# Patient Record
Sex: Female | Born: 1980 | Race: White | Hispanic: No | Marital: Married | Smoking: Current every day smoker
Health system: Southern US, Community
[De-identification: ages and names within clinical notes are randomized; demographics above are authoritative.]

## PROBLEM LIST (undated history)

## (undated) HISTORY — PX: TUBAL LIGATION: SHX77

---

## 2005-10-01 ENCOUNTER — Emergency Department (HOSPITAL_COMMUNITY): Admission: EM | Admit: 2005-10-01 | Discharge: 2005-10-01 | Payer: Self-pay | Admitting: Emergency Medicine

## 2009-05-05 ENCOUNTER — Emergency Department (HOSPITAL_COMMUNITY): Admission: EM | Admit: 2009-05-05 | Discharge: 2009-05-05 | Payer: Self-pay | Admitting: Emergency Medicine

## 2010-11-11 LAB — WET PREP, GENITAL: Trich, Wet Prep: NONE SEEN

## 2010-11-11 LAB — COMPREHENSIVE METABOLIC PANEL
ALT: 13 U/L (ref 0–35)
Albumin: 4.1 g/dL (ref 3.5–5.2)
Alkaline Phosphatase: 54 U/L (ref 39–117)
Calcium: 9.1 mg/dL (ref 8.4–10.5)
Chloride: 105 mEq/L (ref 96–112)
GFR calc Af Amer: >60 mL/min (ref >60)
Glucose, Bld: 103 mg/dL — ABNORMAL HIGH (ref 70–99)
Potassium: 3.8 mEq/L (ref 3.5–5.1)
Sodium: 137 mEq/L (ref 135–145)
Total Protein: 7.1 g/dL (ref 6.0–8.3)

## 2010-11-11 LAB — URINALYSIS, ROUTINE W REFLEX MICROSCOPIC
Bilirubin Urine: NEGATIVE
Glucose, UA: 100 mg/dL — AB
Hgb urine dipstick: NEGATIVE
Nitrite: NEGATIVE
Specific Gravity, Urine: 1.001 — ABNORMAL LOW (ref 1.005–1.030)
pH: 7 (ref 5.0–8.0)

## 2010-11-11 LAB — DIFFERENTIAL
Eosinophils Relative: 0 % (ref 0–5)
Lymphocytes Relative: 12 % (ref 12–46)
Lymphs Abs: 1 10*3/uL (ref 0.7–4.0)
Monocytes Absolute: 1.1 10*3/uL — ABNORMAL HIGH (ref 0.1–1.0)
Monocytes Relative: 13 % — ABNORMAL HIGH (ref 3–12)
Neutrophils Relative %: 75 % (ref 43–77)

## 2010-11-11 LAB — CBC
MCHC: 34.3 g/dL (ref 30.0–36.0)
RBC: 4.64 MIL/uL (ref 3.87–5.11)
WBC: 8.4 10*3/uL (ref 4.0–10.5)

## 2010-12-23 NOTE — Consult Note (Signed)
NAMESANJANA, Tammy Ramsey             ACCOUNT NO.:  [id number]   MEDICAL RECORD NO.:  [id number]          PATIENT TYPE:  EMS   LOCATION:  MAJO                         FACILITY:  [facility]   PHYSICIAN:  Gabrielle Dare. Janee Morn, M.D.DATE OF BIRTH:  1981-03-16   DATE OF CONSULTATION:  10/01/2005  DATE OF DISCHARGE:                                   CONSULTATION   REASON FOR CONSULTATION:  Constipation and abdominal pain.   HISTORY OF PRESENT ILLNESS:  I was asked to evaluate this 30 year old white  female who is five days postpartum in regards to constipation and abdominal  pain by Dr. Carleene Cooper.  The patient has had some workup in the emergency  department including laboratory studies and CT scan of the abdomen and  pelvis.  This demonstrated some mild ileus but no evidence of inflammation  in the right lower quadrant though the appendix was not clearly visualized.  She claims her abdominal pain is better at this time but her main complaint  remains constipation which extends even prior to her delivery.  She also  complains of some vaginal burning.  White blood cell count 17,000 and she is  afebrile.  On her pelvic examination, Dr. Ignacia Palma reported some external  hemorrhoids present.   PAST MEDICAL HISTORY:  Negative.   PAST SURGICAL HISTORY:  Tubal ligation postpartum.   SOCIAL HISTORY:  She smokes cigarettes.   PHYSICAL EXAMINATION:  VITAL SIGNS:  Temperature 97.4, blood pressure  142/64, pulse 96, respirations 18.  GENERAL:  She is awake and alert.  LUNGS:  Lung have mild wheeze, right greater than left.  HEART:  Heart rate is regular.  ABDOMEN:  Abdomen is mildly distended but soft.  She had some minimal  suprapubic tenderness, no masses, no guarding is present, no  hepatosplenomegaly is noted, bowel sounds are present.  EXTREMITIES:  Warm with no significant edema.   DATA REVIEWED:  CT scan findings and laboratory studies as above.   IMPRESSION:  1.  Significant constipation.  2.  External hemorrhoids.  3.  Mild ileus.  4.  No need at this time for emergent surgery.  I recommend MiraLax, Anusol      HC p.r.n. for her hemorrhoids, and I also feel that the patient's CT      oral contrast will help promote bowel movement in the very near future.      Plan for addressing her constipation and hemorrhoids was discussed in      detail with Dr. Ignacia Palma.      Gabrielle Dare Janee Morn, M.D.  Electronically Signed     BET/MEDQ  D:  10/01/2005  T:  10/02/2005  Job:  [id number]   cc:   Osvaldo Human, M.D.  Fax: [phone]

## 2012-07-07 ENCOUNTER — Encounter (HOSPITAL_COMMUNITY): Payer: Self-pay | Admitting: Emergency Medicine

## 2012-07-07 ENCOUNTER — Inpatient Hospital Stay (HOSPITAL_COMMUNITY)
Admission: EM | Admit: 2012-07-07 | Discharge: 2012-07-10 | DRG: 581 | Disposition: A | Discharge status: Home / Self Care | Payer: Medicaid Other | Attending: General Surgery | Admitting: General Surgery

## 2012-07-07 ENCOUNTER — Emergency Department (HOSPITAL_COMMUNITY): Payer: Medicaid Other

## 2012-07-07 DIAGNOSIS — IMO0002 Reserved for concepts with insufficient information to code with codable children: Principal | ICD-10-CM | POA: Diagnosis present

## 2012-07-07 DIAGNOSIS — F17200 Nicotine dependence, unspecified, uncomplicated: Secondary | ICD-10-CM | POA: Diagnosis present

## 2012-07-07 DIAGNOSIS — M60009 Infective myositis, unspecified site: Secondary | ICD-10-CM

## 2012-07-07 LAB — CBC WITH DIFFERENTIAL/PLATELET
Eosinophils Absolute: 0.1 10*3/uL (ref 0.0–0.7)
Eosinophils Relative: 0 % (ref 0–5)
Lymphocytes Relative: 10 % — ABNORMAL LOW (ref 12–46)
MCH: 29.1 pg (ref 26.0–34.0)
MCHC: 34.8 g/dL (ref 30.0–36.0)
MCV: 83.8 fL (ref 78.0–100.0)
Monocytes Absolute: 1.7 10*3/uL — ABNORMAL HIGH (ref 0.1–1.0)
Monocytes Relative: 10 % (ref 3–12)
Neutro Abs: 13.1 10*3/uL — ABNORMAL HIGH (ref 1.7–7.7)
Neutrophils Relative %: 79 % — ABNORMAL HIGH (ref 43–77)
RDW: 14.2 % (ref 11.5–15.5)

## 2012-07-07 LAB — BASIC METABOLIC PANEL
CO2: 27 mEq/L (ref 19–32)
Calcium: 9.9 mg/dL (ref 8.4–10.5)
GFR calc non Af Amer: >90 mL/min (ref >90)

## 2012-07-07 MED ORDER — IOHEXOL 300 MG/ML  SOLN
100.0000 mL | Freq: Once | INTRAMUSCULAR | Status: AC | PRN
  Administered 2012-07-07: 100 mL via INTRAVENOUS

## 2012-07-07 MED ORDER — SODIUM CHLORIDE 0.9 % IV BOLUS (SEPSIS)
1000.0000 mL | Freq: Once | INTRAVENOUS | Status: AC
  Administered 2012-07-07: 1000 mL via INTRAVENOUS

## 2012-07-07 MED ORDER — VANCOMYCIN HCL IN DEXTROSE 1-5 GM/200ML-% IV SOLN
1000.0000 mg | Freq: Once | INTRAVENOUS | Status: AC
  Administered 2012-07-07: 1000 mg via INTRAVENOUS
  Filled 2012-07-07: qty 200

## 2012-07-07 MED ORDER — HYDROMORPHONE HCL PF 1 MG/ML IJ SOLN
1.0000 mg | Freq: Once | INTRAMUSCULAR | Status: AC
  Administered 2012-07-07: 1 mg via INTRAVENOUS
  Filled 2012-07-07: qty 1

## 2012-07-07 MED ORDER — HYDROMORPHONE HCL PF 1 MG/ML IJ SOLN
1.0000 mg | Freq: Once | INTRAMUSCULAR | Status: AC
Start: 2012-07-07 — End: 2012-07-07
  Administered 2012-07-07: 1 mg via INTRAVENOUS
  Filled 2012-07-07: qty 1

## 2012-07-07 MED ORDER — ONDANSETRON HCL 4 MG/2ML IJ SOLN
4.0000 mg | Freq: Once | INTRAMUSCULAR | Status: AC
  Administered 2012-07-07: 4 mg via INTRAVENOUS
  Filled 2012-07-07: qty 2

## 2012-07-07 MED ORDER — ACETAMINOPHEN 500 MG PO TABS
1000.0000 mg | ORAL_TABLET | Freq: Once | ORAL | Status: AC
  Administered 2012-07-07: 1000 mg via ORAL
  Filled 2012-07-07: qty 2

## 2012-07-07 NOTE — ED Provider Notes (Signed)
History     CSN: [id number]  Arrival date & time 07/07/12  1949   First MD Initiated Contact with Patient 07/07/12 2034      Chief Complaint  Patient presents with  . Cellulitis    left arm    (Consider location/radiation/quality/duration/timing/severity/associated sxs/prior treatment) HPI The patient presents with concerns of increasing pain in her left forearm, as well as fever, chills, nausea, vomiting. Yesterday, while at work the patient had a puncture wound of her left forearm.  Her left forearm was struck with a piece of steel.  Daily following that she was treated at another facility, with x-rays, fluids, tetanus, antibiotics.  She states that since discharge within 24 hours ago she has not been able to afford her antibiotics.  Over this time the pain, erythema, induration has increased with persistent nausea, vomiting, generalized discomfort.  Pain is worse with motion, palpation.  No relief with anything. No past medical history on file.  Past Surgical History  Procedure Date  . Tubal ligation     No family history on file.  History  Substance Use Topics  . Smoking status: Current Every Day Smoker -- 1.5 packs/day    Types: Cigarettes  . Smokeless tobacco: Not on file  . Alcohol Use: No    OB History    Grav Para Term Preterm Abortions TAB SAB Ect Mult Living                  Review of Systems  Constitutional:       Per HPI, otherwise negative  HENT:       Per HPI, otherwise negative  Eyes: Negative.   Respiratory:       Per HPI, otherwise negative  Cardiovascular:       Per HPI, otherwise negative  Gastrointestinal: Negative for vomiting.  Genitourinary: Negative.   Musculoskeletal:       Per HPI, otherwise negative  Skin: Negative.   Neurological: Negative for syncope.    Allergies  Review of patient's allergies indicates no known allergies.  Home Medications   Current Outpatient Rx  Name  Route  Sig  Dispense  Refill  . IBUPROFEN 200 MG  PO TABS   Oral   Take 400 mg by mouth every 6 (six) hours as needed. For pain           BP 132/80  Pulse 132  Temp 102.7 F (39.3 C) (Oral)  Resp 25  Ht 5\' 5"  (1.651 m)  Wt 130 lb (58.968 kg)  BMI 21.63 kg/m2  SpO2 97%  LMP 06/10/2012  Physical Exam  Nursing note and vitals reviewed. Constitutional: She is oriented to person, place, and time. She appears well-developed and well-nourished. She appears distressed.       Uncomfortable appearing F- slight diaphoresis  HENT:  Head: Normocephalic and atraumatic.  Eyes: Conjunctivae normal and EOM are normal.  Cardiovascular: Regular rhythm.  Tachycardia present.   Pulmonary/Chest: Breath sounds normal. No stridor. Tachypnea noted. No respiratory distress.  Abdominal: She exhibits no distension.  Musculoskeletal: She exhibits no edema.       Left shoulder: Normal.       Arms: Neurological: She is alert and oriented to person, place, and time. She displays no atrophy. No cranial nerve deficit or sensory deficit. She exhibits normal muscle tone.  Skin: Skin is warm. She is diaphoretic.  Psychiatric: She has a normal mood and affect.    ED Course  Procedures (including critical care time)  Immediately after  arrival, with concerns of sepsis, and progression of her wound, given the tachycardia, tachypnea, fever, she received IV fluids, empiric vancomycin.   Labs Reviewed  CBC WITH DIFFERENTIAL  BASIC METABOLIC PANEL   No results found.   No diagnosis found.  O2- 99%ra, normal  Update: Patient is minimally improved - still tachycardic.    I interpreted the CT, and spoke with our radiologist about the findings.  2315: I d/w Dr. Victorino Dike (ortho).  He was quite helpful, and recommends that I consult hand surgery  2400: I discussed the case with Dr. Izora Ribas who will evaluate and manage the patient.   12:09 AM The patient remains in similar condition, requiring additional meds frequently.  She is less tachycardic.  MDM   This young female presents with increasing pain on a left forearm lesion.  On exam the patient has an indurated lesion that is exquisitely tender to palpation.  She is afebrile, tachycardic.  Given these findings are suspicious for abscess formation.  A CT of the forearm demonstrates intramuscular abscess formation, approximately 10 x 2 x 1.  The patient was empirically started on vancomycin, fluids soon after my initial evaluation.  She requires additional evaluation and management by our orthopedic colleagues.  Dr. Izora Ribas will see the patient.  CRITICAL CARE Performed by: Gerhard Munch   Total critical care time: 35  Critical care time was exclusive of separately billable procedures and treating other patients.  Critical care was necessary to treat or prevent imminent or life-threatening deterioration.  Critical care was time spent personally by me on the following activities: development of treatment plan with patient and/or surrogate as well as nursing, discussions with consultants, evaluation of patient's response to treatment, examination of patient, obtaining history from patient or surrogate, ordering and performing treatments and interventions, ordering and review of laboratory studies, ordering and review of radiographic studies, pulse oximetry and re-evaluation of patient's condition.      Gerhard Munch, MD 07/08/12 [phone]

## 2012-07-07 NOTE — ED Notes (Signed)
[id number]<AV> Expected date:<BR> Expected time:<BR> Means of arrival:<BR> Comments:<BR> Triage 3

## 2012-07-07 NOTE — ED Notes (Signed)
Patient states she cut her arm 2 days ago and was seen for the same.  Patient states she received a shot and prescriptions for ABX last night, but is unable to pay for them.  Laceration, redness, swelling, and pain noted in left arm.  Patient reports fevers, and nausea/vomiting.

## 2012-07-08 ENCOUNTER — Encounter (HOSPITAL_COMMUNITY)
Admission: EM | Disposition: A | Discharge status: Home / Self Care | Payer: Self-pay | Source: Home / Self Care | Attending: General Surgery

## 2012-07-08 ENCOUNTER — Encounter (HOSPITAL_COMMUNITY): Payer: Self-pay | Admitting: Anesthesiology

## 2012-07-08 ENCOUNTER — Emergency Department (HOSPITAL_COMMUNITY): Payer: Medicaid Other | Admitting: Anesthesiology

## 2012-07-08 HISTORY — PX: I & D EXTREMITY: SHX5045

## 2012-07-08 SURGERY — IRRIGATION AND DEBRIDEMENT EXTREMITY
Anesthesia: General | Site: Arm Lower | Laterality: Left | Wound class: Dirty or Infected

## 2012-07-08 MED ORDER — SUCCINYLCHOLINE CHLORIDE 20 MG/ML IJ SOLN
INTRAMUSCULAR | Status: DC | PRN
  Administered 2012-07-08: 100 mg via INTRAVENOUS

## 2012-07-08 MED ORDER — LIDOCAINE HCL (CARDIAC) 20 MG/ML IV SOLN
INTRAVENOUS | Status: DC | PRN
  Administered 2012-07-08: 80 mg via INTRAVENOUS

## 2012-07-08 MED ORDER — SODIUM CHLORIDE 0.9 % IV SOLN
Freq: Once | INTRAVENOUS | Status: AC
  Administered 2012-07-08: 01:00:00 via INTRAVENOUS

## 2012-07-08 MED ORDER — SODIUM CHLORIDE 0.9 % IV SOLN
3.0000 g | Freq: Four times a day (QID) | INTRAVENOUS | Status: DC
  Administered 2012-07-08: 3 g via INTRAVENOUS
  Filled 2012-07-08 (×5): qty 3

## 2012-07-08 MED ORDER — SODIUM CHLORIDE 0.9 % IV BOLUS (SEPSIS): 200.0000 mL | Freq: Once | INTRAVENOUS | Status: DC

## 2012-07-08 MED ORDER — OXYCODONE-ACETAMINOPHEN 5-325 MG PO TABS
1.0000 | ORAL_TABLET | ORAL | Status: DC | PRN
  Administered 2012-07-08 – 2012-07-10 (×11): 2 via ORAL
  Filled 2012-07-08 (×12): qty 2

## 2012-07-08 MED ORDER — HYDROMORPHONE HCL PF 1 MG/ML IJ SOLN
INTRAMUSCULAR | Status: AC
  Administered 2012-07-08: 03:00:00
  Filled 2012-07-08: qty 1

## 2012-07-08 MED ORDER — VANCOMYCIN HCL IN DEXTROSE 1-5 GM/200ML-% IV SOLN
1000.0000 mg | Freq: Two times a day (BID) | INTRAVENOUS | Status: DC
  Administered 2012-07-08 – 2012-07-10 (×5): 1000 mg via INTRAVENOUS
  Filled 2012-07-08 (×7): qty 200

## 2012-07-08 MED ORDER — ZOLPIDEM TARTRATE 5 MG PO TABS: 5.0000 mg | ORAL_TABLET | Freq: Every evening | ORAL | Status: DC | PRN

## 2012-07-08 MED ORDER — BUPIVACAINE HCL (PF) 0.5 % IJ SOLN
INTRAMUSCULAR | Status: DC | PRN
  Administered 2012-07-08: 25 mL

## 2012-07-08 MED ORDER — SODIUM CHLORIDE 0.9 % IV SOLN
INTRAVENOUS | Status: DC | PRN
  Administered 2012-07-08: 02:00:00 via INTRAVENOUS

## 2012-07-08 MED ORDER — FENTANYL CITRATE 0.05 MG/ML IJ SOLN
INTRAMUSCULAR | Status: DC | PRN
  Administered 2012-07-08: 100 ug via INTRAVENOUS

## 2012-07-08 MED ORDER — ONDANSETRON HCL 4 MG/2ML IJ SOLN: 4.0000 mg | Freq: Four times a day (QID) | INTRAMUSCULAR | Status: DC | PRN

## 2012-07-08 MED ORDER — PROPOFOL 10 MG/ML IV BOLUS
INTRAVENOUS | Status: DC | PRN
  Administered 2012-07-08: 150 mg via INTRAVENOUS

## 2012-07-08 MED ORDER — LACTATED RINGERS IV SOLN: INTRAVENOUS | Status: DC

## 2012-07-08 MED ORDER — HYDROMORPHONE HCL PF 1 MG/ML IJ SOLN
0.2500 mg | INTRAMUSCULAR | Status: DC | PRN
  Administered 2012-07-08 (×4): 0.5 mg via INTRAVENOUS

## 2012-07-08 MED ORDER — SODIUM CHLORIDE 0.9 % IV SOLN
3.0000 g | Freq: Four times a day (QID) | INTRAVENOUS | Status: DC
  Administered 2012-07-08 – 2012-07-10 (×10): 3 g via INTRAVENOUS
  Filled 2012-07-08 (×12): qty 3

## 2012-07-08 MED ORDER — ONDANSETRON HCL 4 MG/2ML IJ SOLN
INTRAMUSCULAR | Status: DC | PRN
  Administered 2012-07-08: 4 mg via INTRAVENOUS

## 2012-07-08 MED ORDER — BUPIVACAINE HCL (PF) 0.25 % IJ SOLN
INTRAMUSCULAR | Status: AC
  Filled 2012-07-08: qty 30

## 2012-07-08 MED ORDER — VANCOMYCIN HCL IN DEXTROSE 1-5 GM/200ML-% IV SOLN: 1000.0000 mg | Freq: Two times a day (BID) | INTRAVENOUS | Status: DC

## 2012-07-08 MED ORDER — ONDANSETRON HCL 4 MG PO TABS
4.0000 mg | ORAL_TABLET | Freq: Four times a day (QID) | ORAL | Status: DC | PRN
  Filled 2012-07-08: qty 1

## 2012-07-08 MED ORDER — MORPHINE SULFATE 10 MG/ML IJ SOLN
4.0000 mg | INTRAMUSCULAR | Status: DC | PRN
  Administered 2012-07-08 – 2012-07-10 (×14): 4 mg via INTRAVENOUS
  Filled 2012-07-08 (×14): qty 1

## 2012-07-08 MED ORDER — PROMETHAZINE HCL 25 MG/ML IJ SOLN: 6.2500 mg | INTRAMUSCULAR | Status: DC | PRN

## 2012-07-08 MED ORDER — 0.9 % SODIUM CHLORIDE (POUR BTL) OPTIME
TOPICAL | Status: DC | PRN
  Administered 2012-07-08: 1000 mL

## 2012-07-08 MED ORDER — VITAMINS A & D EX OINT
TOPICAL_OINTMENT | CUTANEOUS | Status: AC
  Administered 2012-07-08: 5
  Filled 2012-07-08: qty 5

## 2012-07-08 SURGICAL SUPPLY — 37 items
BAG SPEC THK2 15X12 ZIP CLS (MISCELLANEOUS) ×1
BAG ZIPLOCK 12X15 (MISCELLANEOUS) ×2 IMPLANT
BANDAGE ELASTIC 4 VELCRO ST LF (GAUZE/BANDAGES/DRESSINGS) ×1 IMPLANT
BANDAGE GAUZE ELAST BULKY 4 IN (GAUZE/BANDAGES/DRESSINGS) ×2 IMPLANT
CANISTER SUCTION 2500CC (MISCELLANEOUS) ×2 IMPLANT
CLOTH BEACON ORANGE TIMEOUT ST (SAFETY) ×2 IMPLANT
CORDS BIPOLAR (ELECTRODE) ×2 IMPLANT
CUFF TOURN SGL QUICK 18 (TOURNIQUET CUFF) ×2 IMPLANT
CUFF TOURN SGL QUICK 24 (TOURNIQUET CUFF)
CUFF TRNQT CYL 24X4X40X1 (TOURNIQUET CUFF) IMPLANT
DRAIN CHANNEL RND F F (WOUND CARE) ×1 IMPLANT
DRAIN PENROSE 18X1/2 LTX STRL (DRAIN) IMPLANT
DRAPE SURG 17X11 SM STRL (DRAPES) ×4 IMPLANT
ELECT REM PT RETURN 9FT ADLT (ELECTROSURGICAL) ×2
ELECTRODE REM PT RTRN 9FT ADLT (ELECTROSURGICAL) ×1 IMPLANT
EVACUATOR SILICONE 100CC (DRAIN) ×1 IMPLANT
GAUZE PACKING IODOFORM 1/4X5 (PACKING) ×1 IMPLANT
GAUZE XEROFORM 1X8 LF (GAUZE/BANDAGES/DRESSINGS) ×1 IMPLANT
GLOVE BIOGEL M STRL SZ7.5 (GLOVE) ×2 IMPLANT
GLOVE BIOGEL PI IND STRL 6.5 (GLOVE) ×1 IMPLANT
GLOVE BIOGEL PI IND STRL 7.0 (GLOVE) ×1 IMPLANT
GLOVE BIOGEL PI IND STRL 7.5 (GLOVE) ×1 IMPLANT
GLOVE BIOGEL PI INDICATOR 6.5 (GLOVE) ×1
GLOVE BIOGEL PI INDICATOR 7.0 (GLOVE) ×1
GLOVE BIOGEL PI INDICATOR 7.5 (GLOVE) ×1
HANDPIECE INTERPULSE COAX TIP (DISPOSABLE)
KIT BASIN OR (CUSTOM PROCEDURE TRAY) ×2 IMPLANT
NEEDLE HYPO 22GX1.5 SAFETY (NEEDLE) ×2 IMPLANT
NS IRRIG 1000ML POUR BTL (IV SOLUTION) ×2 IMPLANT
PACK LOWER EXTREMITY WL (CUSTOM PROCEDURE TRAY) ×2 IMPLANT
POSITIONER SURGICAL ARM (MISCELLANEOUS) ×2 IMPLANT
SET HNDPC FAN SPRY TIP SCT (DISPOSABLE) ×1 IMPLANT
SPONGE GAUZE 4X4 12PLY (GAUZE/BANDAGES/DRESSINGS) ×1 IMPLANT
SUT PROLENE 4 0 PS 2 18 (SUTURE) ×1 IMPLANT
SWAB COLLECTION DEVICE MRSA (MISCELLANEOUS) ×1 IMPLANT
SYR CONTROL 10ML LL (SYRINGE) ×3 IMPLANT
TUBE ANAEROBIC SPECIMEN COL (MISCELLANEOUS) ×2 IMPLANT

## 2012-07-08 NOTE — Transfer of Care (Signed)
Immediate Anesthesia Transfer of Care Note  Patient: Tammy Ramsey  Procedure(s) Performed: Procedure(s) (LRB) with comments: IRRIGATION AND DEBRIDEMENT EXTREMITY (Left) - arm   Patient Location: PACU  Anesthesia Type:General  Level of Consciousness: awake, alert , oriented and patient cooperative  Airway & Oxygen Therapy: Patient Spontanous Breathing and Patient connected to face mask oxygen  Post-op Assessment: Report given to PACU RN, Post -op Vital signs reviewed and stable and Patient moving all extremities X 4  Post vital signs: Reviewed and stable  Complications: No apparent anesthesia complications

## 2012-07-08 NOTE — Plan of Care (Signed)
Problem: Phase I Progression Outcomes Goal: Incision/dressings dry and intact Outcome: Progressing Ace wrap feels appropriate fit, fingers cool, blanch slowly. Barnett Hatter P

## 2012-07-08 NOTE — Op Note (Signed)
NAMEKENSLIE, ABBRUZZESE             ACCOUNT NO.:  [id number]  MEDICAL RECORD NO.:  [id number]  LOCATION:  1311                         FACILITY:  [facility]  PHYSICIAN:  Johnette Abraham, MD    DATE OF BIRTH:  09-22-1980  DATE OF PROCEDURE:  07/08/2012 DATE OF DISCHARGE:                              OPERATIVE REPORT   PREOPERATIVE DIAGNOSIS:  Cellulitis and abscess of the left forearm.  POSTOPERATIVE DIAGNOSIS:  Cellulitis and abscess of the left forearm.  PROCEDURE:  Incision and drainage of abscess of left forearm, limited fasciotomy of left forearm, and insertion of a Blake drain and packing with Iodoform gauze.  ANESTHESIA:  General.  INDICATIONS:  Ms. Deutschman is a 31 year old female who states she cut her arm a couple of days ago.  She presented to the hospital, was given antibiotics. Did not take her oral antibiotics.  Her arm became progressively more swollen and painful.  She re-presented and was worked up by the emergency room here at [facility] with a CAT scan of the arm showing an abscess.  She had a white count of 16,000.  I was consulted for definitive repair.  On evaluation, she had a tensed arm with spreading cellulitis and warmth suggestive of infection.  Risk, benefits, and alternatives of surgery were thoroughly discussed with her including I and D and hospital admission.  She agreed with this plan and consent.  PROCEDURE:  The patient was taken to the operating room, placed supine on the operating table.  Time-out was performed.  General anesthesia was administered without difficulty.  The arm was exsanguinated.  The tourniquet was then inflated to 250 mmHg overlying the previous laceration.  This laceration was opened and lengthened for a few cm. Dissection was carried down to the fascia. The fascia was opened and purulent material was encountered.  This was sent for aerobic and anaerobic cultures.  Probing of the cavity revealed that the CT scan suggested with  abscess formation both proximally and distally.  The fascia was opened in these locations and thoroughly irrigated.  There was quite a bit of purulence here.  Following there was an additional incision was made, both proximally and distally and the abscess cavity was entered for more thorough irrigation of the cavity.  Through these puncture wounds, a partial fasciotomy was performed.  After thorough irrigation, a 16-French Blake drain was inserted from the proximal portion distally, furthermore quarter-inch Iodoform gauze was placed in each 1 of the 3 incisions.  A sterile dressing and Ace wrap were placed. Patient tolerated procedure well and awakened from anesthesia.  She will be admitted to the hospital, placed on IV antibiotics.  Cultures followed.  She will probably need to have a repeat __________ to get the drain out as this may be uncomfortable. After the tourniquet was released, hemostasis was controlled with direct pressure.  There was no significant active bleeding.  We sent the operative note for Ms. Pais.     Johnette Abraham, MD     HCC/MEDQ  D:  07/08/2012  T:  07/08/2012  Job:  [id number]

## 2012-07-08 NOTE — H&P (Signed)
Reason for Consult:infection Referring Physician: ER  Tammy Ramsey is an 31 y.o. right handed female.  HPI: pt cut, ?fb in L forearm on Friday, c/o pain and swelling, worsened, presented to [facility] ER, give abx IV in ER, give rx for antibiotics, pt did not fill, arm became worse today with increased swelling, redness, presented to [facility] ER.  History reviewed. No pertinent past medical history.  Past Surgical History  Procedure Date  . Tubal ligation   Hand surgery - L  History reviewed. No pertinent family history.  Social History:  reports that she has been smoking Cigarettes.  She has been smoking about 1.5 packs per day. She does not have any smokeless tobacco history on file. She reports that she does not drink alcohol or use illicit drugs.  Allergies: No Known Allergies  Medications: I have reviewed the patient's current medications.  Results for orders placed during the hospital encounter of 07/07/12 (from the past 48 hour(s))  CBC WITH DIFFERENTIAL     Status: Abnormal   Collection Time   07/07/12  9:40 PM      Component Value Range Comment   WBC 16.5 (*) 4.0 - 10.5 K/uL    RBC 5.25 (*) 3.87 - 5.11 MIL/uL    Hemoglobin 15.3 (*) 12.0 - 15.0 g/dL    HCT 19.1  47.8 - 29.5 %    MCV 83.8  78.0 - 100.0 fL    MCH 29.1  26.0 - 34.0 pg    MCHC 34.8  30.0 - 36.0 g/dL    RDW 62.1  30.8 - 65.7 %    Platelets 241  150 - 400 K/uL    Neutrophils Relative 79 (*) 43 - 77 %    Neutro Abs 13.1 (*) 1.7 - 7.7 K/uL    Lymphocytes Relative 10 (*) 12 - 46 %    Lymphs Abs 1.7  0.7 - 4.0 K/uL    Monocytes Relative 10  3 - 12 %    Monocytes Absolute 1.7 (*) 0.1 - 1.0 K/uL    Eosinophils Relative 0  0 - 5 %    Eosinophils Absolute 0.1  0.0 - 0.7 K/uL    Basophils Relative 0  0 - 1 %    Basophils Absolute 0.0  0.0 - 0.1 K/uL   BASIC METABOLIC PANEL     Status: Abnormal   Collection Time   07/07/12  9:40 PM      Component Value Range Comment   Sodium 129 (*) 135 - 145 mEq/L    Potassium 3.2  (*) 3.5 - 5.1 mEq/L    Chloride 90 (*) 96 - 112 mEq/L    CO2 27  19 - 32 mEq/L    Glucose, Bld 114 (*) 70 - 99 mg/dL    BUN 5 (*) 6 - 23 mg/dL    Creatinine, Ser 8.46  0.50 - 1.10 mg/dL    Calcium 9.9  8.4 - 96.2 mg/dL    GFR calc non Af Amer >90  >90 mL/min    GFR calc Af Amer >90  >90 mL/min     Ct Forearm Left W Contrast  07/07/2012  *RADIOLOGY REPORT*  Clinical Data: Cut forearm on metal, with erythema and swelling extending up the arm.  CT OF THE LEFT FOREARM WITH CONTRAST  Technique:  Multidetector CT imaging was performed following the standard protocol during bolus administration of intravenous contrast.  Contrast: OMNIPAQUE IOHEXOL 300 MG/ML  SOLN  Comparison: Left elbow radiographs performed earlier today  at 05:38 a.m.  Findings: There is a long focal peripherally enhancing abscess noted within the musculature of the posterior compartment of the forearm, measuring approximately 10.5 x 1.9 x 0.9 cm, predominately involving the extensor carpi ulnaris, extensor digiti minimi and abductor pollicis longus.  The abscess appears to extend slightly about the adjacent proximal ulna, though it resolves proximal to the level of the elbow joint.  Distally, the abscess extends along the extensor carpi ulnaris tendon, though it appears to resolve well proximal to the insertion.  Associated edema and soft tissue inflammation is noted within the posterior compartment, particularly prominent about the proximal medial aspect of the extensor carpi radialis brevis.  There is no definite evidence of vascular compromise, though the posterior lower vasculature is more difficult to assess due to surrounding edema.  Prominent subcutaneous edema is noted along the dorsal forearm and at the level of the elbow joint.  No osseous erosions are seen to suggest osteomyelitis.  The visualized osseous structures are grossly unremarkable.  The carpal rows appear intact.  The elbow joint is grossly unremarkable in  appearance.  The known soft tissue laceration is not well characterized.  IMPRESSION:  1.  Long focal peripherally enhancing abscess noted within the musculature of the posterior part of the forearm, measuring 10.5 x 1.9 x 0.9 cm, predominately involving the extensor carpi ulnaris, extensor digiti minimi and abductor pollicis longus. The abscess appears to extend slightly about the adjacent proximal ulna, though resolves proximal to the elbow joint.  Distally, it extends along the extensor carpi ulnaris tendon, though it appears to resolve well proximal to the insertion. 2.  Associated edema and soft tissue inflammation within the posterior compartment, particularly prominent about the proximal medial aspect of the extensor carpi radialis brevis.  Diffuse subcutaneous edema noted along the dorsal forearm and at the level of the elbow joint.  3.  The visualized osseous structures appear grossly intact.  These results were called by telephone on 07/07/2012 at 10:58 p.m. to Dr. Gerhard Munch, who verbally acknowledged these results.   Original Report Authenticated By: Tonia Ghent, M.D.     Pertinent items are noted in HPI. Temp:  [102.7 F (39.3 C)] 102.7 F (39.3 C) (12/01 2011) Pulse Rate:  [132] 132  (12/01 2011) Resp:  [25] 25  (12/01 2011) BP: (132)/(80) 132/80 mmHg (12/01 2011) SpO2:  [97 %] 97 % (12/01 2011) Weight:  [58.968 kg (130 lb)] 58.968 kg (130 lb) (12/01 2011) General appearance: alert and cooperative Resp: clear to auscultation bilaterally Cardio: regular rate and rhythm GI: soft, non-tender; bowel sounds normal; no masses,  no organomegaly Extremities: extremities normal, atraumatic, no cyanosis or edema - R; LUE with erythema just distal to elbow, tight, warm to touch, ~2cm laceration partially healed, hand and fingers wnl, no pain with gentle elbow motion   Assessment/Plan: Infection/abscess L forearm Plan:  Will i&d in OR, risks discussed with patient.  Tammy Ramsey  CHRISTOPHER 07/08/2012, 12:56 AM

## 2012-07-08 NOTE — Plan of Care (Signed)
Problem: Phase I Progression Outcomes Goal: Tubes/drains patent Outcome: Progressing JP drain will not hold charge, MD aware.  Barnett Hatter P

## 2012-07-08 NOTE — Anesthesia Preprocedure Evaluation (Addendum)
Anesthesia Evaluation  Patient identified by MRN, date of birth, ID band Patient awake    Reviewed: Allergy & Precautions, H&P , NPO status , Patient's Chart, lab work & pertinent test results  Airway Mallampati: II TM Distance: >3 FB Neck ROM: Full    Dental  (+) Teeth Intact and Dental Advisory Given   Pulmonary Current Smoker,  + rhonchi         Cardiovascular negative cardio ROS  Rhythm:Regular Rate:Normal     Neuro/Psych negative neurological ROS  negative psych ROS   GI/Hepatic negative GI ROS, Neg liver ROS,   Endo/Other  negative endocrine ROS  Renal/GU negative Renal ROS  negative genitourinary   Musculoskeletal negative musculoskeletal ROS (+)   Abdominal   Peds  Hematology negative hematology ROS (+)   Anesthesia Other Findings   Reproductive/Obstetrics negative OB ROS                          Anesthesia Physical Anesthesia Plan  ASA: II and emergent  Anesthesia Plan: General   Post-op Pain Management:    Induction: Intravenous, Rapid sequence and Cricoid pressure planned  Airway Management Planned: Oral ETT  Additional Equipment:   Intra-op Plan:   Post-operative Plan: Extubation in OR  Informed Consent: I have reviewed the patients History and Physical, chart, labs and discussed the procedure including the risks, benefits and alternatives for the proposed anesthesia with the patient or authorized representative who has indicated his/her understanding and acceptance.   Dental advisory given  Plan Discussed with: CRNA  Anesthesia Plan Comments:         Anesthesia Quick Evaluation

## 2012-07-08 NOTE — Progress Notes (Signed)
S:c/o pain, unable to sleep.  O:Blood pressure 118/75, pulse 108, temperature 98.3 F (36.8 C), temperature source Oral, resp. rate 18, height 5\' 5"  (1.651 m), weight 58.968 kg (130 lb), last menstrual period 06/10/2012, SpO2 96.00%. Results for orders placed during the hospital encounter of 05/05/09  WET PREP, GENITAL     Status: Abnormal   Collection Time   05/05/09 11:05 AM      Component Value Range Status Comment   Yeast Wet Prep HPF POC NONE SEEN  NONE SEEN Final    Trich, Wet Prep NONE SEEN  NONE SEEN Final    Clue Cells Wet Prep HPF POC FEW (*) NONE SEEN Final    WBC, Wet Prep HPF POC FEW (*) NONE SEEN Final   GC/CHLAMYDIA PROBE AMP, GENITAL     Status: Normal   Collection Time   05/05/09 11:05 AM      Component Value Range Status Comment   GC Probe Amp, Genital    NEGATIVE Final    Value: NEGATIVE     (NOTE)  Testing performed using the BD Probetec ET Chlamydia trachomatis and Neisseria gonorrhea amplified DNA assay.   Chlamydia, DNA Probe    NEGATIVE Final    Value: NEGATIVE     (NOTE)  Testing performed using the BD Probetec ET Chlamydia trachomatis and Neisseria gonorrhea amplified DNA assay.   LUE: warm, swollen, still with erythema, able to move fingers, some drainage in tube, serosanginous cx's pending  A:s/p I&D L forearm P: await cultures, remove packing, start dressing changes, cont abx, f/u labs tomorrow   P:

## 2012-07-08 NOTE — Progress Notes (Signed)
ANTIBIOTIC CONSULT NOTE - INITIAL  Pharmacy Consult for Vanc Indication: Cellulitis  No Known Allergies  Patient Measurements: Height: 5\' 5"  (165.1 cm) Weight: 130 lb (58.968 kg) IBW/kg (Calculated) : 57    Vital Signs: Temp: 98.4 F (36.9 C) (12/02 0400) BP: 142/98 mmHg (12/02 0400) Pulse Rate: 101  (12/02 0400) Intake/Output from previous day: 12/01 0701 - 12/02 0700 In: 840 [P.O.:240; I.V.:600] Out: 10 [Blood:10] Intake/Output from this shift:    Labs:  [facility] 07/07/12 2140  WBC 16.5*  HGB 15.3*  PLT 241  LABCREA --  CREATININE 0.54   Estimated Creatinine Clearance: 93.4 ml/min (by C-G formula based on Cr of 0.54). No results found for this basename: VANCOTROUGH:2,VANCOPEAK:2,VANCORANDOM:2,GENTTROUGH:2,GENTPEAK:2,GENTRANDOM:2,TOBRATROUGH:2,TOBRAPEAK:2,TOBRARND:2,AMIKACINPEAK:2,AMIKACINTROU:2,AMIKACIN:2, in the last 72 hours   Microbiology: No results found for this or any previous visit (from the past 720 hour(s)).  Medical History: History reviewed. No pertinent past medical history.  Medications:  Anti-infectives     Start     Dose/Rate Route Frequency Ordered Stop   07/08/12 0900   vancomycin (VANCOCIN) IVPB 1000 mg/200 mL premix  Status:  Discontinued        1,000 mg 200 mL/hr over 60 Minutes Intravenous Every 12 hours 07/08/12 0853 07/08/12 0853   07/08/12 0900   vancomycin (VANCOCIN) IVPB 1000 mg/200 mL premix        1,000 mg 200 mL/hr over 60 Minutes Intravenous Every 12 hours 07/08/12 0855     07/08/12 0600   Ampicillin-Sulbactam (UNASYN) 3 g in sodium chloride 0.9 % 100 mL IVPB        3 g 100 mL/hr over 60 Minutes Intravenous Every 6 hours 07/08/12 0511     07/08/12 0300   Ampicillin-Sulbactam (UNASYN) 3 g in sodium chloride 0.9 % 100 mL IVPB  Status:  Discontinued        3 g 100 mL/hr over 60 Minutes Intravenous Every 6 hours 07/08/12 0241 07/08/12 0511   07/07/12 2100   vancomycin (VANCOCIN) IVPB 1000 mg/200 mL premix        1,000  mg 200 mL/hr over 60 Minutes Intravenous  Once 07/07/12 2056 07/07/12 2240         Assessment:  Pharmacy asked to dose Vancomycin for cellulitis of LUE  Pt received 1g IV Vanc last night in ER ~2200  WBC elevated, Scr wnl (CrCl >147ml/min/1.73m2), Tm 102.7  Pt also on D# 1 Unasyn 3g IV q6h  Goal of Therapy:  Vancomycin trough level 10-15 mcg/ml  Plan:  Vancomycin 1g IV q12h Follow labs, vitals and Cx Vanc Tr at Css if necessary Adjust dose as appropriate  Machel Violante PharmD  [phone] 07/08/2012 8:59 AM

## 2012-07-08 NOTE — ED Notes (Signed)
Surgeon at bedside. CHG bath completed by Tawanna Cooler, NT

## 2012-07-08 NOTE — Care Management Note (Signed)
    Page 1 of 1   07/08/2012     10:59:43 AM   CARE MANAGEMENT NOTE 07/08/2012  Patient:  Tammy Ramsey, Tammy Ramsey   Account Number:  [id number]  Date Initiated:  07/08/2012  Documentation initiated by:  Lorenda Ishihara  Subjective/Objective Assessment:   31 yo female admitted with abscess of arm, I&D in OR. PTA lived at home with friend.     Action/Plan:   Home when stable.   Anticipated DC Date:  07/11/2012   Anticipated DC Plan:  HOME/SELF CARE      DC Planning Services  CM consult  Medication Assistance      Choice offered to / List presented to:             Status of service:  In process, will continue to follow Medicare Important Message given?   (If response is "NO", the following Medicare IM given date fields will be blank) Date Medicare IM given:   Date Additional Medicare IM given:    Discharge Disposition:    Per UR Regulation:  Reviewed for med. necessity/level of care/duration of stay  If discussed at Long Length of Stay Meetings, dates discussed:    Comments:

## 2012-07-08 NOTE — Preoperative (Signed)
Beta Blockers   Reason not to administer Beta Blockers:Not Applicable 

## 2012-07-08 NOTE — Anesthesia Postprocedure Evaluation (Signed)
Anesthesia Post Note  Patient: Tammy Ramsey  Procedure(s) Performed: Procedure(s) (LRB): IRRIGATION AND DEBRIDEMENT EXTREMITY (Left)  Anesthesia type: General  Patient location: PACU  Post pain: Pain level controlled  Post assessment: Post-op Vital signs reviewed  Last Vitals:  Filed Vitals:   07/07/12 2011  BP: 132/80  Pulse: 132  Temp: 39.3 C  Resp: 25    Post vital signs: Reviewed  Level of consciousness: sedated  Complications: No apparent anesthesia complications

## 2012-07-09 ENCOUNTER — Encounter (HOSPITAL_COMMUNITY): Payer: Self-pay | Admitting: General Surgery

## 2012-07-09 LAB — BASIC METABOLIC PANEL
CO2: 28 mEq/L (ref 19–32)
Calcium: 8.4 mg/dL (ref 8.4–10.5)
GFR calc non Af Amer: >90 mL/min (ref >90)
Glucose, Bld: 123 mg/dL — ABNORMAL HIGH (ref 70–99)
Potassium: 3 mEq/L — ABNORMAL LOW (ref 3.5–5.1)
Sodium: 137 mEq/L (ref 135–145)

## 2012-07-09 LAB — CBC
Hemoglobin: 12 g/dL (ref 12.0–15.0)
MCH: 28.7 pg (ref 26.0–34.0)
MCHC: 34 g/dL (ref 30.0–36.0)
Platelets: 228 10*3/uL (ref 150–400)
RBC: 4.18 MIL/uL (ref 3.87–5.11)

## 2012-07-09 MED ORDER — POTASSIUM CHLORIDE CRYS ER 20 MEQ PO TBCR
40.0000 meq | EXTENDED_RELEASE_TABLET | Freq: Two times a day (BID) | ORAL | Status: DC
  Administered 2012-07-09 – 2012-07-10 (×2): 40 meq via ORAL
  Filled 2012-07-09 (×3): qty 2

## 2012-07-10 MED ORDER — DOXYCYCLINE HYCLATE 100 MG PO TABS: 100.0000 mg | ORAL_TABLET | Freq: Two times a day (BID) | ORAL | Status: AC

## 2012-07-10 MED ORDER — CEPHALEXIN 500 MG PO CAPS
500.0000 mg | ORAL_CAPSULE | Freq: Four times a day (QID) | ORAL | Status: DC
  Filled 2012-07-10 (×3): qty 1

## 2012-07-10 MED ORDER — OXYCODONE-ACETAMINOPHEN 5-325 MG PO TABS: 1.0000 | ORAL_TABLET | ORAL | Status: AC | PRN

## 2012-07-10 MED ORDER — CEPHALEXIN 500 MG PO CAPS: 500.0000 mg | ORAL_CAPSULE | Freq: Four times a day (QID) | ORAL | Status: AC

## 2012-07-10 MED ORDER — DOXYCYCLINE HYCLATE 100 MG PO TABS
100.0000 mg | ORAL_TABLET | Freq: Two times a day (BID) | ORAL | Status: DC
  Filled 2012-07-10: qty 1

## 2012-07-10 NOTE — Progress Notes (Signed)
S: arm feels better, pt wants to go home.  O:Blood pressure 108/50, pulse 78, temperature 98.2 F (36.8 C), temperature source Oral, resp. rate 18, height 5\' 5"  (1.651 m), weight 58.968 kg (130 lb), last menstrual period 06/10/2012, SpO2 99.00%. Results for orders placed during the hospital encounter of 07/07/12  WOUND CULTURE     Status: Normal (Preliminary result)   Collection Time   07/08/12  2:22 AM      Component Value Range Status Comment   Specimen Description WOUND ARM LEFT   Final    Special Requests NONE   Final    Gram Stain     Final    Value: FEW WBC PRESENT,BOTH PMN AND MONONUCLEAR     NO SQUAMOUS EPITHELIAL CELLS SEEN     NO ORGANISMS SEEN   Culture Culture reincubated for better growth   Final    Report Status PENDING   Incomplete   ANAEROBIC CULTURE     Status: Normal (Preliminary result)   Collection Time   07/08/12  2:22 AM      Component Value Range Status Comment   Specimen Description WOUND ARM LEFT   Final    Special Requests NONE   Final    Gram Stain     Final    Value: FEW WBC PRESENT,BOTH PMN AND MONONUCLEAR     NO SQUAMOUS EPITHELIAL CELLS SEEN     NO ORGANISMS SEEN   Culture     Final    Value: NO ANAEROBES ISOLATED; CULTURE IN PROGRESS FOR 5 DAYS   Report Status PENDING   Incomplete    LUE, decreased swelling and erythema, still some warmth, minimal drainage from drain, able to move fingers and elbow without significant discomfort. Cx's?? As above  A:s/p i&d abscess L forearm   P:Drian removed.   will d/c home on oral abx, wound care

## 2012-07-10 NOTE — Progress Notes (Signed)
JP drain 5 cc's sanguinous drainage emptied since 1800 yesterday.

## 2012-07-11 LAB — WOUND CULTURE

## 2012-07-11 NOTE — Discharge Summary (Signed)
[name], Tammy Ramsey             ACCOUNT NO.:  [id number]  MEDICAL RECORD NO.:  [id number]  LOCATION:  1311                         FACILITY:  [facility]  PHYSICIAN:  Johnette Abraham, MD    DATE OF BIRTH:  1981-01-12  DATE OF ADMISSION:  07/07/2012 DATE OF DISCHARGE:  07/10/2012                              DISCHARGE SUMMARY   PREOPERATIVE DIAGNOSIS:  Cellulitis and abscess of the left forearm.  DISCHARGE DIAGNOSIS:  Cellulitis and abscess of the left forearm, status post incision and drainage of the abscess.  REASON FOR ADMISSION:  [name] is a 31 year old female, who had a laceration to her arm several days prior to admission.  She was seen at an outside facility.  There was concern for an abscess in the left forearm.  She was given a prescription for antibiotic.  The patient was unable to get the antibiotics filled.  She re-presented that evening with worsening signs and symptoms to the [facility] Emergency Room, where a CAT scan was done revealing an abscess in her forearm.  She was febrile, tachycardic with a white count of 16,000.  In the emergency room, I was consulted for definitive repair.  On evaluation, she had cellulitis and CT again noted that large abscess along the forearm of the left arm she was consented and taken to surgery that evening for extensive I and D of the abscess.  Iodoform gauze was placed.  The drain was placed.  She was started on broad-spectrum antibiotics.  On postoperative day 1, she still had significant swelling, erythema, and signs of infection.  Antibiotics chosen were Unasyn and vancomycin.  Her packing was removed on day #1 with instructions for the nursing staff to irrigate all wounds with normal saline and provide dressing coverage. On postoperative day #3, which is today, Wednesday, the 4th, her white count had decreased.  She was afebrile for 24 hours.  Her edema had markedly decreased.  She had less erythema, and it appeared that  the infection was being adequately treated.  Her drain was removed the day of discharge.  She was given specific instructions for wound care.  She will return to my office in 1 week's time for removal of the stitches. She was given a prescription for Percocet for pain, Keflex and doxycycline, and she is encouraged to get on as soon as possible so the infection is completely obliterated.  Her diet is regular.  Activity is regular.  I want her to keep her left arm clean and covered at all times.     Johnette Abraham, MD     HCC/MEDQ  D:  07/10/2012  T:  07/11/2012  Job:  [id number]

## 2012-07-13 LAB — ANAEROBIC CULTURE

## 2014-02-13 IMAGING — CT CT FOREARM*L* W/CM
3 of 4 series · 8 of 20 positions shown, 9 images · IV contrast (omnipaque)
Comparison: Left elbow radiographs performed earlier today at [DATE]
a.m.

CLINICAL DATA: Cut forearm on metal, with erythema and swelling
extending up the arm.

CT OF THE LEFT FOREARM WITH CONTRAST
TECHNIQUE: Multidetector CT imaging was performed following the
standard protocol during bolus administration of intravenous
contrast.
Contrast: 100mL OMNIPAQUE IOHEXOL 300 MG/ML  SOLN

[Series 3: knee bone windows · axial · 0.36mm/px · z∈[+1347,+1461]mm · 2 of 171 slices shown]
[im 57/171  bone]
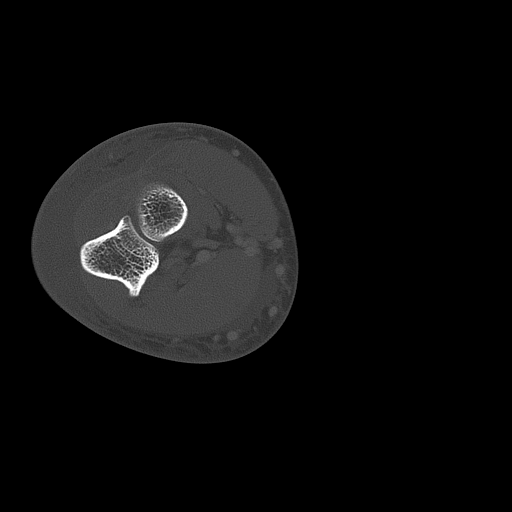
[im 114/171  bone]
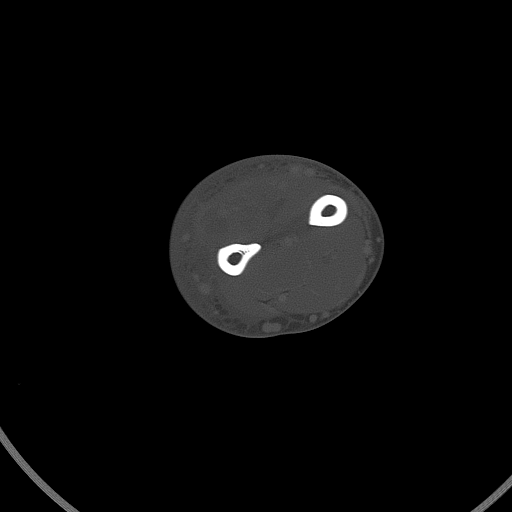

[Series 4: knee st · axial · 0.36mm/px · z∈[+1319,+1489]mm · 3 of 171 slices shown, 4 images]
[im 43/171  soft-tissue]
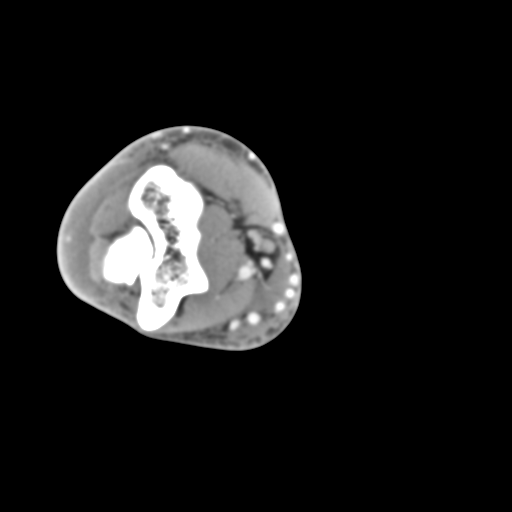
[im 43/171  bone]
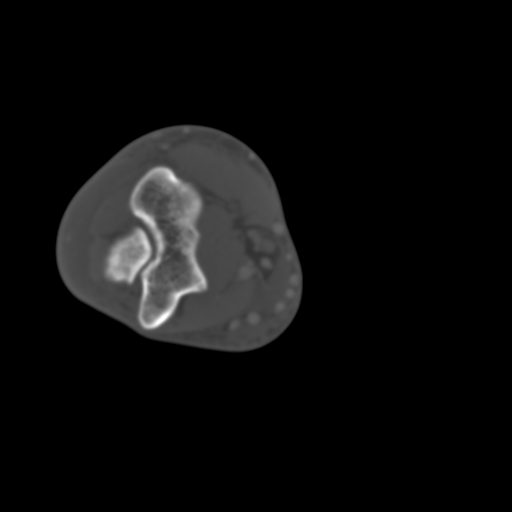
[im 86/171  bone]
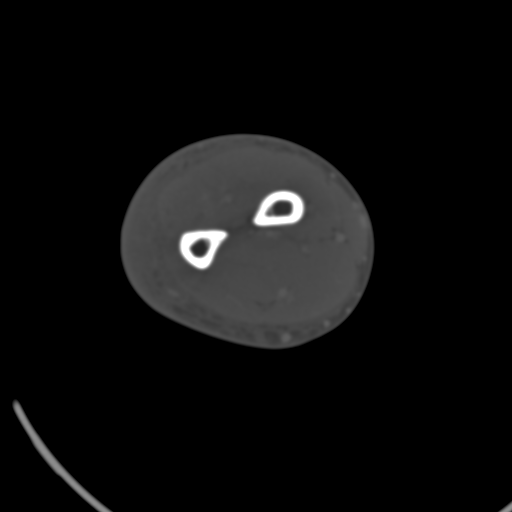
[im 128/171  bone]
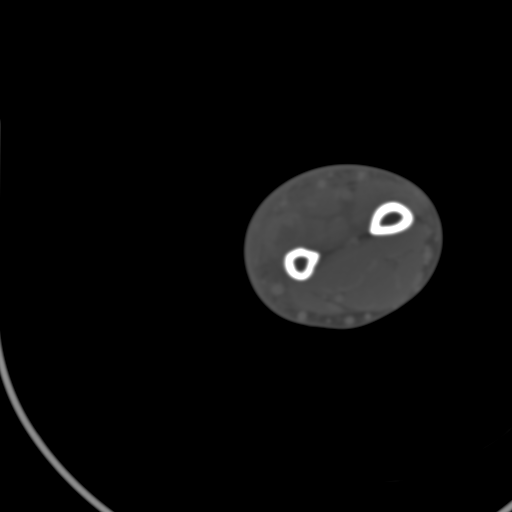

[Series 604: axial · axial · 0.67mm/px · z∈[+1358,+1518]mm · 3 of 165 slices shown]
[im 42/165  bone]
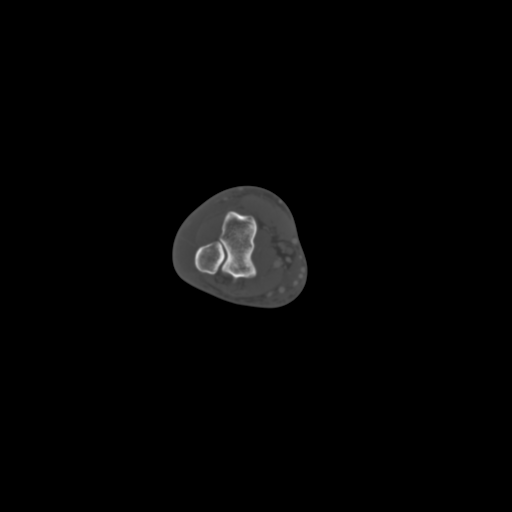
[im 83/165  bone]
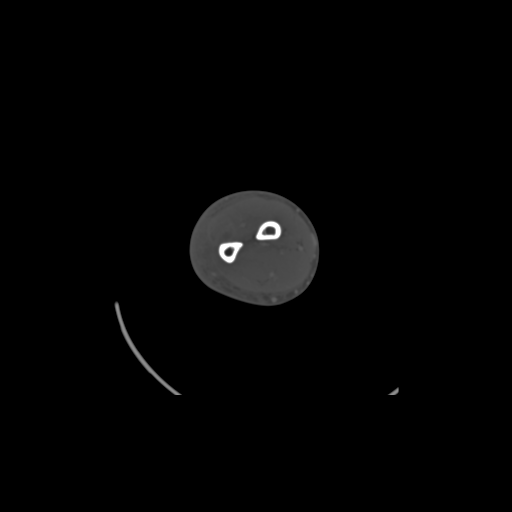
[im 124/165  bone]
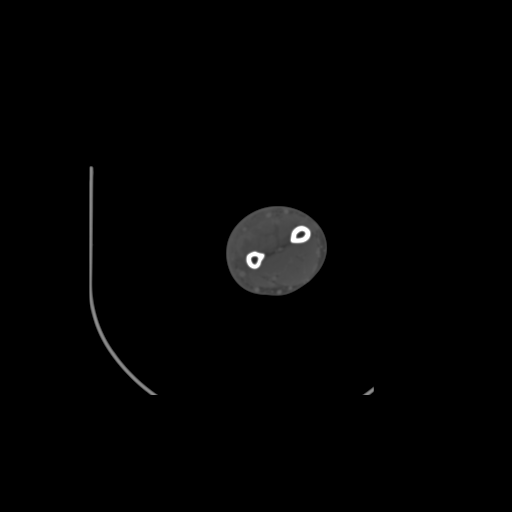

[8 of 20 positions shown; findings below may reference images not displayed]

FINDINGS: There is a long focal peripherally enhancing abscess
noted within the musculature of the posterior compartment of the
forearm, measuring approximately 10.5 x 1.9 x 0.9 cm, predominately
involving the extensor carpi ulnaris, extensor digiti minimi and
abductor pollicis longus.  The abscess appears to extend slightly
about the adjacent proximal ulna, though it resolves proximal to
the level of the elbow joint.  Distally, the abscess extends along
the extensor carpi ulnaris tendon, though it appears to resolve
well proximal to the insertion.

Associated edema and soft tissue inflammation is noted within the
posterior compartment, particularly prominent about the proximal
medial aspect of the extensor carpi radialis brevis.  There is no
definite evidence of vascular compromise, though the posterior
lower vasculature is more difficult to assess due to surrounding
edema.

Prominent subcutaneous edema is noted along the dorsal forearm and
at the level of the elbow joint.  No osseous erosions are seen to
suggest osteomyelitis.  The visualized osseous structures are
grossly unremarkable.  The carpal rows appear intact.  The elbow
joint is grossly unremarkable in appearance.  The known soft tissue
laceration is not well characterized.
IMPRESSION: 1.  Long focal peripherally enhancing abscess noted within the
musculature of the posterior part of the forearm, measuring 10.5 x
1.9 x 0.9 cm, predominately involving the extensor carpi ulnaris,
extensor digiti minimi and abductor pollicis longus. The abscess
appears to extend slightly about the adjacent proximal ulna, though
resolves proximal to the elbow joint.  Distally, it extends along
the extensor carpi ulnaris tendon, though it appears to resolve
well proximal to the insertion.
2.  Associated edema and soft tissue inflammation within the
posterior compartment, particularly prominent about the proximal
medial aspect of the extensor carpi radialis brevis.  Diffuse
subcutaneous edema noted along the dorsal forearm and at the level
of the elbow joint.

3.  The visualized osseous structures appear grossly intact.

to Dr. Miki Seka Kuffer, who verbally acknowledged these results.

## 2020-09-26 ENCOUNTER — Other Ambulatory Visit: Payer: Self-pay | Admitting: Internal Medicine

## 2021-08-17 ENCOUNTER — Other Ambulatory Visit: Payer: Self-pay

## 2021-08-17 ENCOUNTER — Ambulatory Visit: Payer: Medicaid Other | Admitting: Family

## 2022-02-19 ENCOUNTER — Ambulatory Visit (HOSPITAL_COMMUNITY)
Admission: EM | Admit: 2022-02-19 | Discharge: 2022-02-19 | Disposition: A | Discharge status: Home / Self Care | Payer: Medicaid Other

## 2022-02-19 ENCOUNTER — Encounter (HOSPITAL_COMMUNITY): Payer: Self-pay

## 2022-02-19 ENCOUNTER — Other Ambulatory Visit: Payer: Self-pay

## 2022-02-19 ENCOUNTER — Emergency Department (HOSPITAL_COMMUNITY)
Admission: EM | Admit: 2022-02-19 | Discharge: 2022-02-19 | Discharge status: Against Medical Advice | Payer: Medicaid Other | Source: Home / Self Care

## 2022-02-19 DIAGNOSIS — M549 Dorsalgia, unspecified: Secondary | ICD-10-CM

## 2022-02-19 NOTE — Discharge Instructions (Signed)
Sent to ED via POV ?
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
## 2023-10-03 ENCOUNTER — Institutional Professional Consult (permissible substitution): Payer: MEDICAID | Admitting: Pulmonary Disease

## 2023-10-03 NOTE — Progress Notes (Deleted)
 Synopsis: Referred in *** for ***  Subjective:   PATIENT ID: Tammy Ramsey GENDER: female DOB: November 26, 1980, MRN: [id number]   HPI  No chief complaint on file.   ***  No past medical history on file.   No family history on file.   Social History   Socioeconomic History   Marital status: Married    Spouse name: Not on file   Number of children: Not on file   Years of education: Not on file   Highest education level: Not on file  Occupational History   Not on file  Tobacco Use   Smoking status: Every Day    Current packs/day: 1.50    Types: Cigarettes   Smokeless tobacco: Not on file  Substance and Sexual Activity   Alcohol use: No   Drug use: No   Sexual activity: Yes  Other Topics Concern   Not on file  Social History Narrative   Not on file   Social Drivers of Health   Financial Resource Strain: Not on file  Food Insecurity: Not on file  Transportation Needs: Not on file  Physical Activity: Not on file  Stress: Not on file  Social Connections: Not on file  Intimate Partner Violence: Not on file     No Known Allergies   Outpatient Medications Prior to Visit  Medication Sig Dispense Refill   cephALEXin (KEFLEX) 500 MG capsule Take 1 capsule (500 mg total) by mouth every 6 (six) hours. 28 capsule 0   doxycycline (VIBRA-TABS) 100 MG tablet Take 1 tablet (100 mg total) by mouth every 12 (twelve) hours. 20 tablet 0   ibuprofen (ADVIL,MOTRIN) 200 MG tablet Take 400 mg by mouth every 6 (six) hours as needed. For pain     oxyCODONE-acetaminophen (PERCOCET/ROXICET) 5-325 MG per tablet Take 1-2 tablets by mouth every 4 (four) hours as needed for pain. 30 tablet 0   No facility-administered medications prior to visit.    ROS    Objective:  There were no vitals filed for this visit.   Physical Exam    CBC    Component Value Date/Time   WBC 12.3 (H) 07/09/2012 0423   RBC 4.18 07/09/2012 0423   HGB 12.0 07/09/2012 0423   HCT 35.3 (L) 07/09/2012  0423   PLT 228 07/09/2012 0423   MCV 84.4 07/09/2012 0423   MCH 28.7 07/09/2012 0423   MCHC 34.0 07/09/2012 0423   RDW 14.0 07/09/2012 0423   LYMPHSABS 1.7 07/07/2012 2140   MONOABS 1.7 (H) 07/07/2012 2140   EOSABS 0.1 07/07/2012 2140   BASOSABS 0.0 07/07/2012 2140     Chest imaging:  PFT:     No data to display          Labs:  Path:  Echo:  Heart Catheterization:       Assessment & Plan:   No diagnosis found.  Discussion: ***    Current Outpatient Medications:    cephALEXin (KEFLEX) 500 MG capsule, Take 1 capsule (500 mg total) by mouth every 6 (six) hours., Disp: 28 capsule, Rfl: 0   doxycycline (VIBRA-TABS) 100 MG tablet, Take 1 tablet (100 mg total) by mouth every 12 (twelve) hours., Disp: 20 tablet, Rfl: 0   ibuprofen (ADVIL,MOTRIN) 200 MG tablet, Take 400 mg by mouth every 6 (six) hours as needed. For pain, Disp: , Rfl:    oxyCODONE-acetaminophen (PERCOCET/ROXICET) 5-325 MG per tablet, Take 1-2 tablets by mouth every 4 (four) hours as needed for pain., Disp: 30 tablet, Rfl: 0

## 2023-11-29 ENCOUNTER — Ambulatory Visit: Payer: MEDICAID | Admitting: Pulmonary Disease

## 2023-11-29 ENCOUNTER — Encounter: Payer: Self-pay | Admitting: Pulmonary Disease

## 2023-11-29 NOTE — Progress Notes (Deleted)
 Synopsis: Referred in April 2025 for COPD evaluation  Subjective:   PATIENT ID: Tammy Ramsey GENDER: female DOB: [birth year], MRN: [id number]   HPI  No chief complaint on file.  Jersey Espinoza is a 43 year old woman, daily smoker who is referred to pulmonary clinic for evaluation of COPD.     No past medical history on file.   No family history on file.   Social History   Socioeconomic History   Marital status: Married    Spouse name: Not on file   Number of children: Not on file   Years of education: Not on file   Highest education level: Not on file  Occupational History   Not on file  Tobacco Use   Smoking status: Every Day    Current packs/day: 1.50    Types: Cigarettes   Smokeless tobacco: Not on file  Substance and Sexual Activity   Alcohol use: No   Drug use: No   Sexual activity: Yes  Other Topics Concern   Not on file  Social History Narrative   Not on file   Social Drivers of Health   Financial Resource Strain: Not on file  Food Insecurity: Not on file  Transportation Needs: Not on file  Physical Activity: Not on file  Stress: Not on file  Social Connections: Not on file  Intimate Partner Violence: Not on file     No Known Allergies   Outpatient Medications Prior to Visit  Medication Sig Dispense Refill   cephALEXin  (KEFLEX ) 500 MG capsule Take 1 capsule (500 mg total) by mouth every 6 (six) hours. 28 capsule 0   doxycycline  (VIBRA -TABS) 100 MG tablet Take 1 tablet (100 mg total) by mouth every 12 (twelve) hours. 20 tablet 0   ibuprofen (ADVIL,MOTRIN) 200 MG tablet Take 400 mg by mouth every 6 (six) hours as needed. For pain     oxyCODONE -acetaminophen  (PERCOCET/ROXICET) 5-325 MG per tablet Take 1-2 tablets by mouth every 4 (four) hours as needed for pain. 30 tablet 0   No facility-administered medications prior to visit.    ROS    Objective:  There were no vitals filed for this visit.   Physical Exam    CBC    Component  Value Date/Time   WBC 12.3 (H) 07/09/2012 0423   RBC 4.18 07/09/2012 0423   HGB 12.0 07/09/2012 0423   HCT 35.3 (L) 07/09/2012 0423   PLT 228 07/09/2012 0423   MCV 84.4 07/09/2012 0423   MCH 28.7 07/09/2012 0423   MCHC 34.0 07/09/2012 0423   RDW 14.0 07/09/2012 0423   LYMPHSABS 1.7 07/07/2012 2140   MONOABS 1.7 (H) 07/07/2012 2140   EOSABS 0.1 07/07/2012 2140   BASOSABS 0.0 07/07/2012 2140     Chest imaging:  PFT:     No data to display          Labs:  Path:  Echo:  Heart Catheterization:       Assessment & Plan:   No diagnosis found.  Discussion: ***    Current Outpatient Medications:    cephALEXin  (KEFLEX ) 500 MG capsule, Take 1 capsule (500 mg total) by mouth every 6 (six) hours., Disp: 28 capsule, Rfl: 0   doxycycline  (VIBRA -TABS) 100 MG tablet, Take 1 tablet (100 mg total) by mouth every 12 (twelve) hours., Disp: 20 tablet, Rfl: 0   ibuprofen (ADVIL,MOTRIN) 200 MG tablet, Take 400 mg by mouth every 6 (six) hours as needed. For pain, Disp: , Rfl:    oxyCODONE -acetaminophen  (  PERCOCET/ROXICET) 5-325 MG per tablet, Take 1-2 tablets by mouth every 4 (four) hours as needed for pain., Disp: 30 tablet, Rfl: 0
